# Patient Record
Sex: Female | Born: 1975 | Race: White | Hispanic: No | Smoking: Never smoker
Health system: Southern US, Community
[De-identification: ages and names within clinical notes are randomized; demographics above are authoritative.]

---

## 2005-01-25 ENCOUNTER — Observation Stay: Payer: Self-pay | Admitting: Obstetrics & Gynecology

## 2005-02-17 ENCOUNTER — Inpatient Hospital Stay: Payer: Self-pay

## 2005-12-18 ENCOUNTER — Ambulatory Visit: Payer: Self-pay | Admitting: Family Medicine

## 2006-09-30 ENCOUNTER — Ambulatory Visit: Payer: Self-pay | Admitting: Gastroenterology

## 2007-11-19 IMAGING — US ABDOMEN ULTRASOUND
1 series · 17 of 25 positions shown · non-contrast
Comparison: none

REASON FOR EXAM: Reflux. Focus on gallbladder
COMMENTS:

PROCEDURE:     US  - US ABDOMEN GENERAL SURVEY  - December 18, 2005  [DATE]
RESULT:
HISTORY: Reflux, gallbladder disease.

[Series 1: abdomen ultrasound · 17 of 44 slices shown]
[im 1/44]
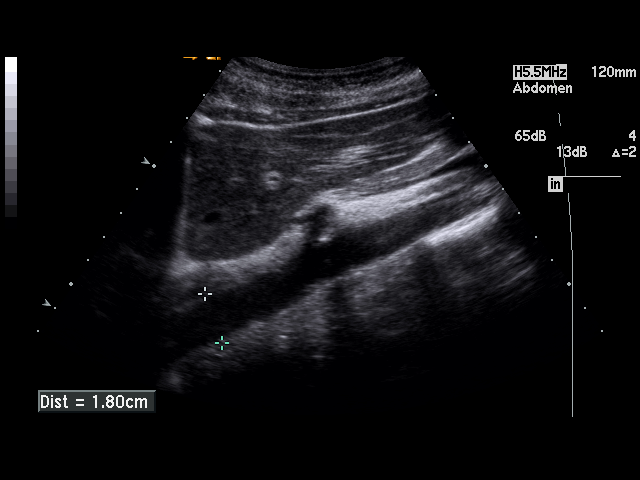
[im 4/44]
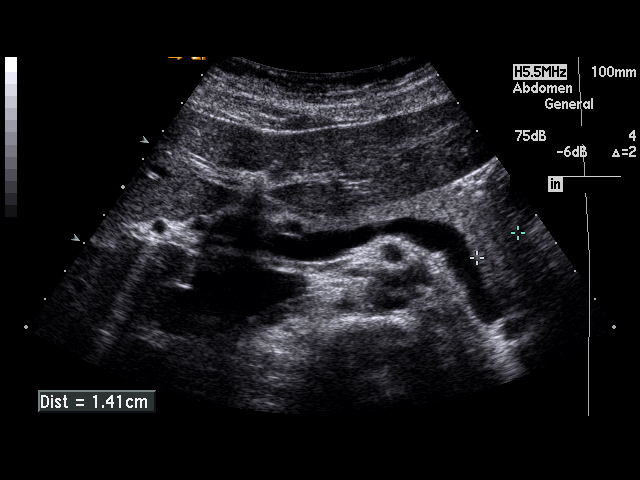
[im 6/44]
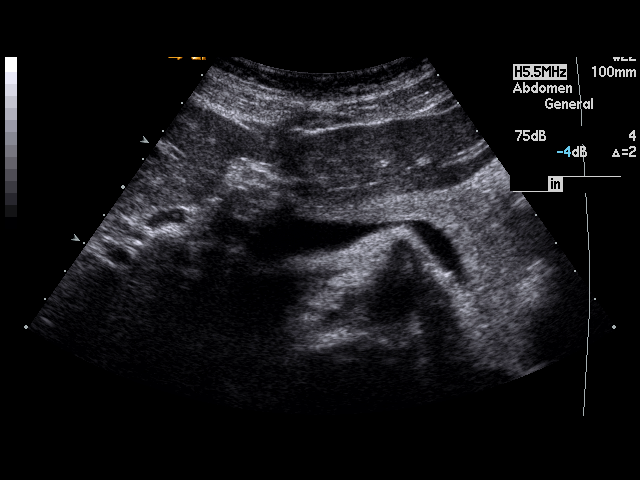
[im 9/44]
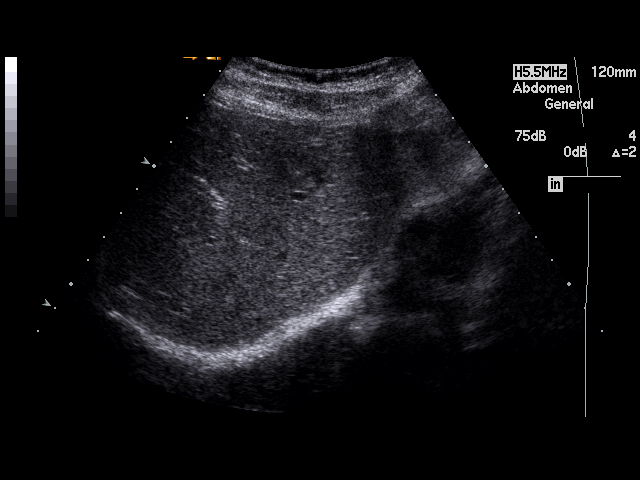
[im 11/44]
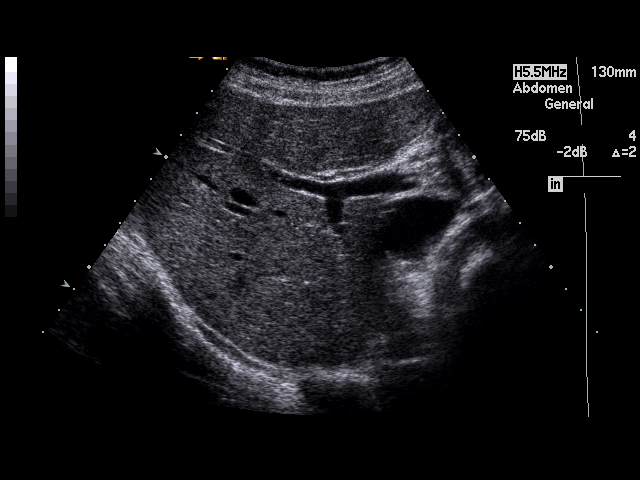
[im 15/44]
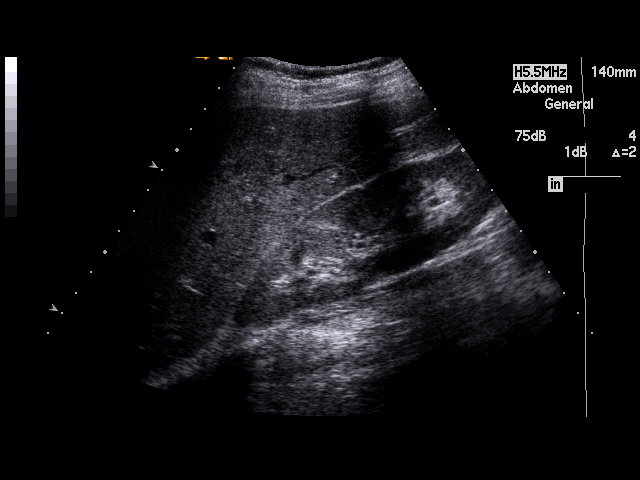
[im 17/44]
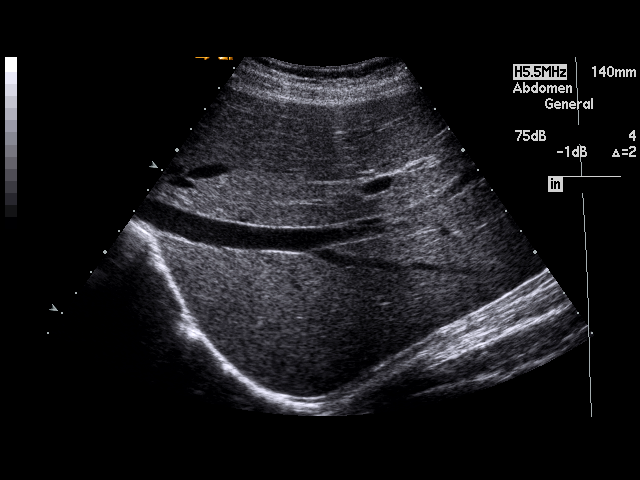
[im 20/44]
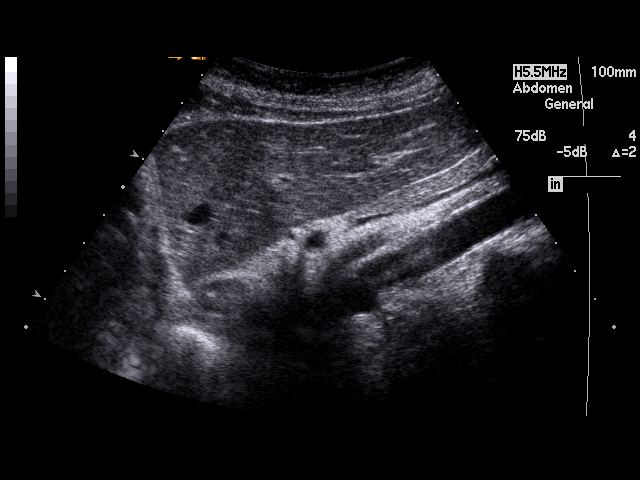
[im 22/44]
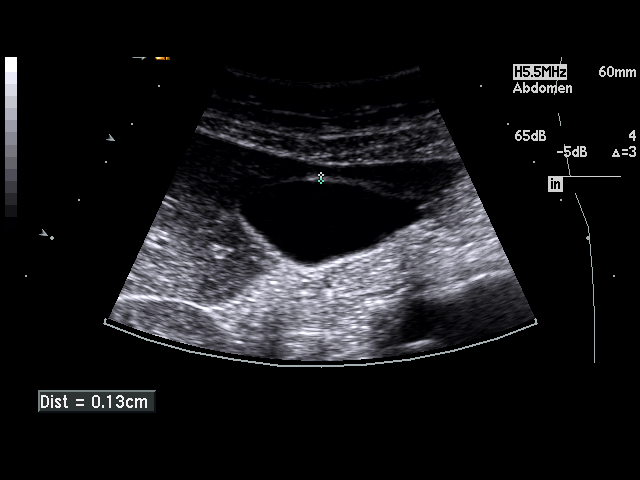
[im 24/44]
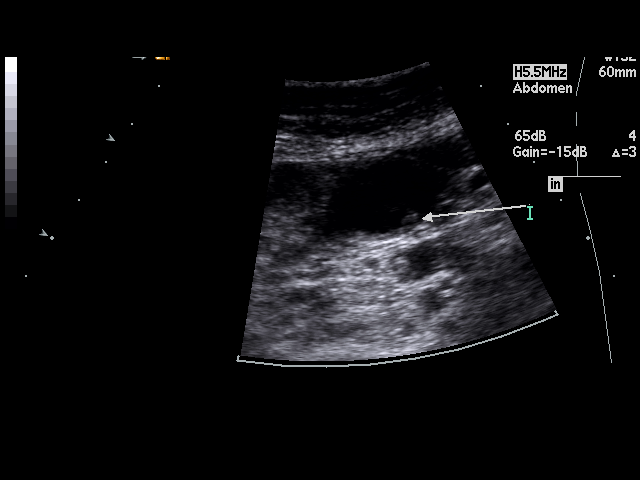
[im 27/44]
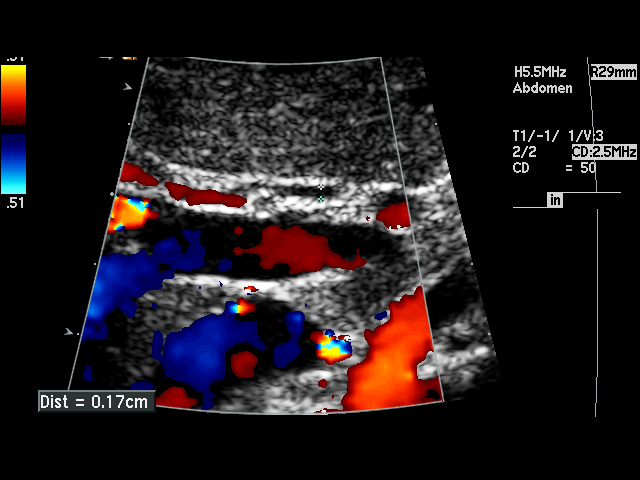
[im 29/44]
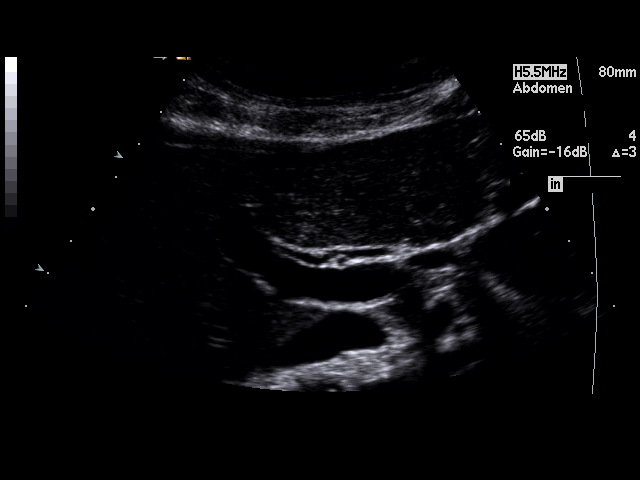
[im 33/44]
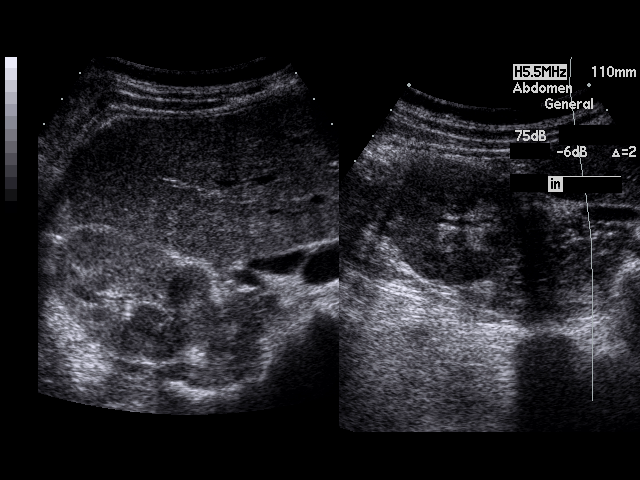
[im 35/44]
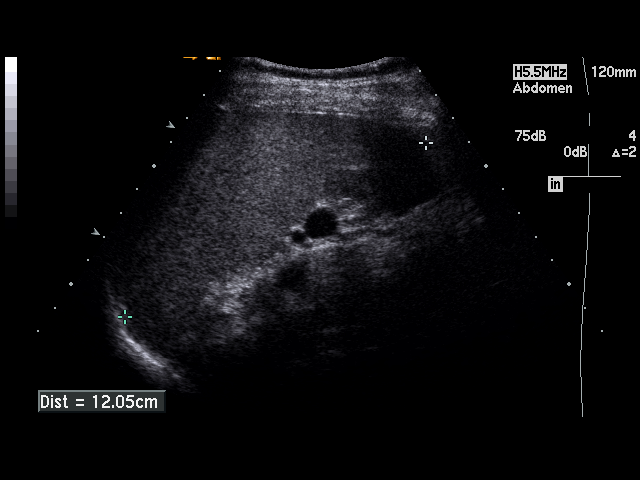
[im 38/44]
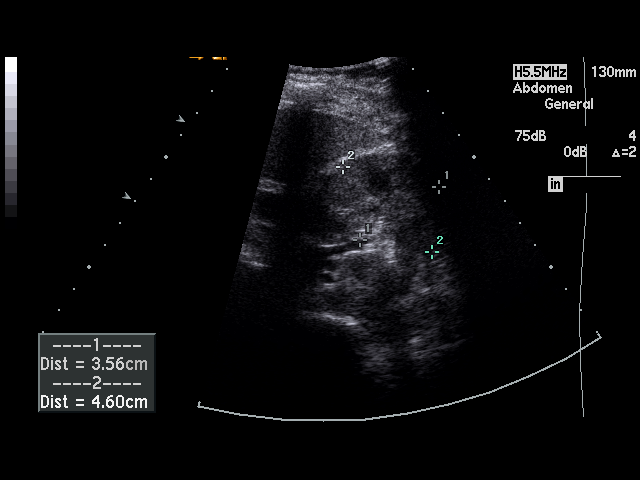
[im 40/44]
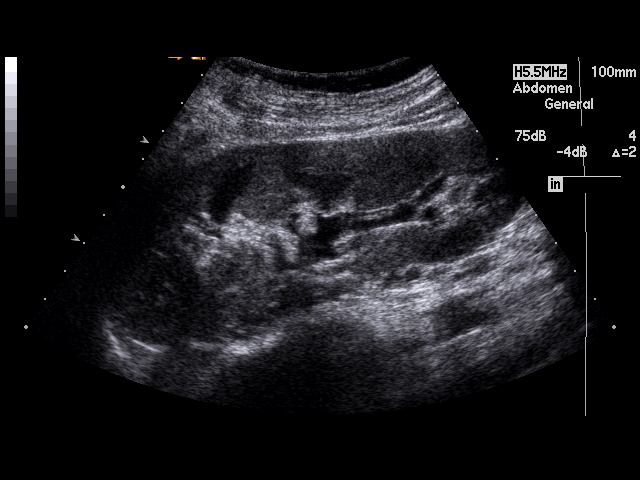
[im 44/44]
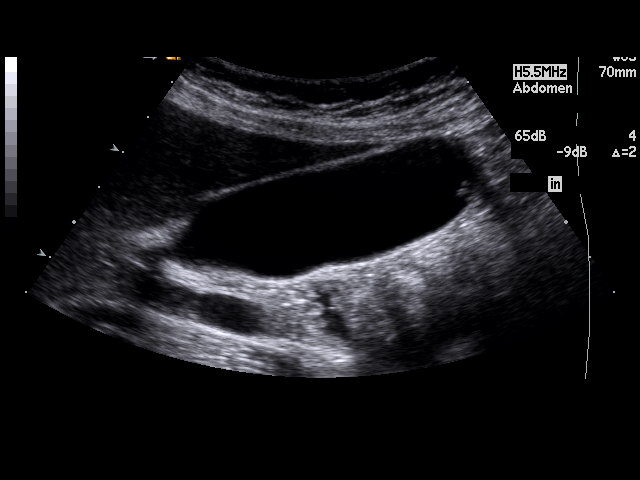

[17 of 25 positions shown; findings below may reference images not displayed]

FINDINGS: Standard abdominal ultrasound was obtained.  The liver is normal.
The pancreas is normal.  Gallstones are present.  No gallbladder wall
thickening is noted.  Common bile duct is normal at 18 mm.  There is no
hydronephrosis or splenomegaly.  The spleen measures 12 cm.  The RIGHT
kidney measures 13 cm in maximal length. The LEFT 12.8 cm.
IMPRESSION: Gallstones, otherwise normal exam.

## 2008-10-30 ENCOUNTER — Encounter: Admission: RE | Admit: 2008-10-30 | Discharge: 2008-10-30 | Payer: Self-pay | Admitting: Obstetrics and Gynecology

## 2009-03-22 ENCOUNTER — Ambulatory Visit: Payer: Self-pay | Admitting: Internal Medicine

## 2009-04-24 ENCOUNTER — Encounter: Admission: RE | Admit: 2009-04-24 | Discharge: 2009-04-24 | Payer: Self-pay | Admitting: Internal Medicine

## 2009-07-06 ENCOUNTER — Ambulatory Visit: Payer: Self-pay | Admitting: Internal Medicine

## 2009-07-10 ENCOUNTER — Ambulatory Visit: Payer: Self-pay | Admitting: Internal Medicine

## 2009-08-06 ENCOUNTER — Ambulatory Visit: Payer: Self-pay | Admitting: Unknown Physician Specialty

## 2018-05-13 ENCOUNTER — Encounter

## 2018-05-13 ENCOUNTER — Ambulatory Visit: Payer: Self-pay | Admitting: Psychiatry

## 2018-05-27 ENCOUNTER — Ambulatory Visit: Payer: Self-pay | Admitting: Psychiatry

## 2018-05-27 ENCOUNTER — Encounter: Payer: Self-pay | Admitting: Psychiatry

## 2018-05-27 DIAGNOSIS — F411 Generalized anxiety disorder: Secondary | ICD-10-CM

## 2018-05-27 NOTE — Progress Notes (Signed)
      [facility] Counselor/Therapist Progress Note  Patient ID: Kimberly Russo, MRN: [id number],    Date: 05/27/2018  Time Spent: 52 minutes   Treatment Type: Individual Therapy  Reported Symptoms: Anxious Mood  Mental Status Exam:  Appearance:   Casual and Well Groomed     Behavior:  Appropriate  Motor:  Normal  Speech/Language:   Clear and Coherent  Affect:  Appropriate  Mood:  anxious  Thought process:  normal  Thought content:    WNL  Sensory/Perceptual disturbances:    WNL  Orientation:  oriented to person, place, time/date and situation  Attention:  Good  Concentration:  Good  Memory:  WNL  Fund of knowledge:   Good  Insight:    Good  Judgment:   Good  Impulse Control:  Good   Risk Assessment: Danger to Self:  No Self-injurious Behavior: No Danger to Others: No Duty to Warn:no Physical Aggression / Violence:No  Access to Firearms a concern: No  Gang Involvement:No   Subjective: The client reports that she was very encouraged by her last session.  Today we began with the EMDR over her current anxiety and the sense of a loss of control.  Her negative thought was, "I am so uncertain."  She feels anxiety in her chest and her subjective units of distress is 5+.  As the client processed her anxiety went up to probably a 7+.  She noted that her anxiety started morphing into the fibromyalgia pain that she feels in her arms and her jaw.  As the client continues she interpreted the feelings as, "not knowing what to do, being misunderstood, there is something wrong with me or this is more than I can handle." As the client gained insight into this her anxiety began to drop precipitously.  I explained to the client about her personality style with the Rosalio Macadamia type indicator.  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  Which means she is less than 2% of the population.  The client found a lot of relief in this.  And she began to have a greater acceptance of who she was.  The client  was surprised that I had been able to assess her so well.  I encouraged the client to develop more skills and learn how to do things on her own.  Her positive cognition was, "I am seen"  Her subjective units of distress was less than 1 at the end of the session.  Interventions: Solution-Oriented/Positive Psychology, Eye Movement Desensitization and Reprocessing (EMDR) and Insight-Oriented  Diagnosis:   ICD-10-CM   1. Generalized anxiety disorder F41.1     Plan: Positive self talk, acceptance, skill development.  Kimberly Russo, [place]

## 2018-06-02 ENCOUNTER — Encounter: Payer: Self-pay | Admitting: Psychiatry

## 2018-06-02 ENCOUNTER — Ambulatory Visit: Payer: Self-pay | Admitting: Psychiatry

## 2018-06-02 DIAGNOSIS — F411 Generalized anxiety disorder: Secondary | ICD-10-CM

## 2018-06-02 NOTE — Progress Notes (Signed)
      [facility] Counselor/Therapist Progress Note  Patient ID: Carlena Hurldrienne H Mcbane, MRN: [id number],    Date: 06/02/2018  Time Spent: 51 minutes   Treatment Type: Individual Therapy  Reported Symptoms: Anxious Mood and Ruminations  Mental Status Exam:  Appearance:   Casual and Well Groomed     Behavior:  Appropriate  Motor:  Normal  Speech/Language:   Clear and Coherent  Affect:  Appropriate  Mood:  anxious and sad  Thought process:  normal  Thought content:    WNL  Sensory/Perceptual disturbances:    WNL  Orientation:  oriented to person, place, time/date and situation  Attention:  Good  Concentration:  Good  Memory:  WNL  Fund of knowledge:   Good  Insight:    Good  Judgment:   Good  Impulse Control:  Good   Risk Assessment: Danger to Self:  No Self-injurious Behavior: No Danger to Others: No Duty to Warn:no Physical Aggression / Violence:No  Access to Firearms a concern: No  Gang Involvement:No   Subjective: The client reports that it was not a stressful week for her which is a huge change.  Today she wanted to focus on her oldest son Alycia RossettiRyan who is 43 years old.  He is over anxious and the client feels responsible for his problems.  She felt she was to she was too much of a disciplinarian and was too anxious around him as well.  She discussed the fact that she is concerned he has poor hygiene and is so much of an introvert and stays to himself. I used EMDR with the client focusing on her son.  Her negative cognition was, "I need to let it go."  She felt guilt in her chest it was subjective units of distress of a 4+.  As the client processed she saw that maybe she was putting too much on him and expecting him to be more mature.  I explained that boys tend to develop socially more slowly and that as he gets older he will take more of an interest in his personal hygiene.  She is worried that he is like her father who is an alcoholic.  He stays at home on the computer overeats and  weighs 300 pounds.  I pointed out with the client that her son takes 100 mg of Zoloft which she states is helping him.  Her father probably resorted to alcohol to deal with his own anxiety as a way to self medicate.  She realized that her son does not have to do that.  He is very different from her father.  "I have to allow him to grow up."  The client's subjective units of distress was at 1 at the end of the session.  Interventions: Solution-Oriented/Positive Psychology, Eye Movement Desensitization and Reprocessing (EMDR) and Insight-Oriented  Diagnosis:   ICD-10-CM   1. Generalized anxiety disorder F41.1     Plan: Boundaries, positive self talk.  Gelene MinkFrederick Maleya Leever, [place]

## 2018-06-10 ENCOUNTER — Ambulatory Visit: Payer: Self-pay | Admitting: Psychiatry

## 2018-06-10 ENCOUNTER — Encounter: Payer: Self-pay | Admitting: Psychiatry

## 2018-06-10 DIAGNOSIS — F411 Generalized anxiety disorder: Secondary | ICD-10-CM

## 2018-06-10 NOTE — Progress Notes (Signed)
      [facility] Counselor/Therapist Progress Note  Patient ID: Kimberly Russo, MRN: [id number],    Date: 06/10/2018  Time Spent: 50 minutes   Treatment Type: Individual Therapy  Reported Symptoms: Anxious Mood  Mental Status Exam:  Appearance:   Casual and Well Groomed     Behavior:  Appropriate  Motor:  Normal  Speech/Language:   Clear and Coherent  Affect:  Appropriate  Mood:  anxious  Thought process:  normal  Thought content:    WNL  Sensory/Perceptual disturbances:    WNL  Orientation:  oriented to person, place, time/date and situation  Attention:  Good  Concentration:  Good  Memory:  WNL  Fund of knowledge:   Good  Insight:    Good  Judgment:   Good  Impulse Control:  Good   Risk Assessment: Danger to Self:  No Self-injurious Behavior: No Danger to Others: No Duty to Warn:no Physical Aggression / Violence:No  Access to Firearms a concern: No  Gang Involvement:No   Subjective: The client reports that the work we did at the last session was helpful.  "I am better with my son."  She has been concerned about his hygiene because, "his feet stink."  I explained to the client that that is not uncommon for boys or girls going through puberty.  The client was unaware of this and immediately had more acceptance.  She felt a sense of relief.  She reported today that she was having a "fibro-flare up."  She wanted to talk about her parents because they worry about everything.  "It dries my anxiety up.  The client notes that she operates out of fear because of this.  We discussed how her mother's behavior introduces a level of uncertainty to the client.  Her negative thought was, "I become fearful out of protection."  Her level of disturbance was a 6.  As she processed her feelings about her parents she realized that it was tainted by shame.  She feels guilty about talking about her parents.  I asked the client to try to take nonjudgmental stance because we were trying to  understand the dynamics that have affected her.  She agreed.  Putting her parents behavior into perspective helped her see how unhealthy it was.  The client was able to reduce her guilt and fear and shame to a subjective units of distress of less than 3 at the end of the session.  Going forward she will focus on trying to trust her own judgment.  Interventions: Assertiveness/Communication, Solution-Oriented/Positive Psychology, Eye Movement Desensitization and Reprocessing (EMDR) and Insight-Oriented  Diagnosis:   ICD-10-CM   1. Generalized anxiety disorder F41.1     Plan: Positive self talk, boundaries, assertiveness.  Kimberly Russo Kimberly Russo, [place]

## 2018-06-17 ENCOUNTER — Ambulatory Visit: Payer: Self-pay | Admitting: Psychiatry

## 2018-06-24 ENCOUNTER — Encounter: Payer: Self-pay | Admitting: Psychiatry

## 2018-06-24 ENCOUNTER — Ambulatory Visit: Payer: Self-pay | Admitting: Psychiatry

## 2018-06-24 DIAGNOSIS — F411 Generalized anxiety disorder: Secondary | ICD-10-CM

## 2018-06-24 NOTE — Progress Notes (Signed)
      [facility] Counselor/Therapist Progress Note  Patient ID: Kimberly Russo, MRN: [id number],    Date: 06/24/2018  Time Spent: 50 minutes   Treatment Type: Individual Therapy  Reported Symptoms: Anxious Mood  Mental Status Exam:  Appearance:   Casual and Well Groomed     Behavior:  Appropriate  Motor:  Normal  Speech/Language:   Clear and Coherent  Affect:  Appropriate  Mood:  anxious  Thought process:  normal  Thought content:    WNL  Sensory/Perceptual disturbances:    WNL  Orientation:  oriented to person, place, time/date and situation  Attention:  Good  Concentration:  Good  Memory:  WNL  Fund of knowledge:   Good  Insight:    Good  Judgment:   Good  Impulse Control:  Good   Risk Assessment: Danger to Self:  No Self-injurious Behavior: No Danger to Others: No Duty to Warn:no Physical Aggression / Violence:No  Access to Firearms a concern: No  Gang Involvement:No   Subjective: The client states [place]  Today she is very anxious and noted that she has a lot of stress around dinner.  She discussed how her dad was always stressful at dinnertime.  Eventually in early middle school the family quit eating together.  Today we used EMDR to help the client separate her dad's behavior from how she think God would treat her.  Her concern is that God is really uninvolved with her like her dad.  As the client processed her subjective units of distress went from a 6+ to less than 1.  She was able to separate her father from her relationship with God.  She saw that God was always with her and that her father was just flawed. Today the client's fibromyalgia had flared up quite a bit.  We discussed how the client's gut health could impact not only her fibromyalgia but her mental health as well.  I gave the client some handouts and information to read concerning that.  She will review that and determine what she will do.  Interventions:  Assertiveness/Communication, Solution-Oriented/Positive Psychology, Eye Movement Desensitization and Reprocessing (EMDR) and Insight-Oriented  Diagnosis:   ICD-10-CM   1. Generalized anxiety disorder F41.1     Plan: Boundaries, assertiveness, diet and self-care.  Kimberly Russo, [place]

## 2018-07-01 ENCOUNTER — Ambulatory Visit: Payer: Self-pay | Admitting: Psychiatry

## 2018-07-01 ENCOUNTER — Encounter: Payer: Self-pay | Admitting: Psychiatry

## 2018-07-01 DIAGNOSIS — F411 Generalized anxiety disorder: Secondary | ICD-10-CM

## 2018-07-01 NOTE — Progress Notes (Signed)
      [facility] Counselor/Therapist Progress Note  Patient ID: Kimberly Russo, MRN: [id number],    Date: 07/01/2018  Time Spent: 48 minutes   Treatment Type: Individual Therapy  Reported Symptoms: Anxiety.  Mental Status Exam:  Appearance:   Casual     Behavior:  Appropriate  Motor:  Normal  Speech/Language:   Clear and Coherent  Affect:  Appropriate  Mood:  anxious  Thought process:  normal  Thought content:    WNL  Sensory/Perceptual disturbances:    WNL  Orientation:  oriented to person, place, time/date and situation  Attention:  Good  Concentration:  Good  Memory:  WNL  Fund of knowledge:   Good  Insight:    Good  Judgment:   Good  Impulse Control:  Good   Risk Assessment: Danger to Self:  No Self-injurious Behavior: No Danger to Others: No Duty to Warn:no Physical Aggression / Violence:No  Access to Firearms a concern: No  Gang Involvement:No   Subjective: Sudden memory she had a playing piano.  "I need to do it right."  She was worried about her dad's criticism.  It also plays into someone else listening or people watching.  People's opinion matter a lot.  "I am always apologizing."  We started with E MDR today around the target of piano playing.  Her negative thought was "I cannot do it right."  As she processed she realized that she put her identity and performance.  Performance leads to need for control which leads to fear if she cannot control.  The client also realized that her dad did not have a good way of communicating praise to her and was trying the best he can.  She realized that she had misinterpreted his behavior through a negative lens.  She noted that she believed it was really not for her what she thought he communicated.  Her positive cognition at the end of the session was, "I can be okay."  Interventions: Solution-Oriented/Positive Psychology, Eye Movement Desensitization and Reprocessing (EMDR) and Insight-Oriented  Diagnosis:   ICD-10-CM    1. Generalized anxiety disorder F41.1     Plan: Positive self talk, diet, exercise, self-care, boundaries, and assertiveness.  Kimberly Russo, [place]

## 2018-07-08 ENCOUNTER — Ambulatory Visit: Payer: Self-pay | Admitting: Psychiatry

## 2018-07-15 ENCOUNTER — Ambulatory Visit: Payer: Self-pay | Admitting: Psychiatry

## 2018-07-15 ENCOUNTER — Encounter: Payer: Self-pay | Admitting: Psychiatry

## 2018-07-15 DIAGNOSIS — F411 Generalized anxiety disorder: Secondary | ICD-10-CM

## 2018-07-15 NOTE — Progress Notes (Signed)
      [facility] Counselor/Therapist Progress Note  Patient ID: MADALYNN PIZANO, MRN: [id number],    Date: 07/15/2018  Time Spent: 51 minutes   Treatment Type: Individual Therapy  Reported Symptoms: anxiety  Mental Status Exam:  Appearance:   Well Groomed     Behavior:  Appropriate  Motor:  Normal  Speech/Language:   Clear and Coherent  Affect:  Full Range  Mood:  anxious  Thought process:  normal  Thought content:    WNL  Sensory/Perceptual disturbances:    WNL  Orientation:  oriented to person, place, time/date and situation  Attention:  Good  Concentration:  Good  Memory:  WNL  Fund of knowledge:   Good  Insight:    Good  Judgment:   Good  Impulse Control:  Good   Risk Assessment: Danger to Self:  No Self-injurious Behavior: No Danger to Others: No Duty to Warn:no Physical Aggression / Violence:No  Access to Firearms a concern: No  Gang Involvement:No   Subjective: "What is churning in my soul today is about my dad."  The client's oldest son is getting ready to graduate high school.  Extended family are coming in from out of town and her parents wanted to stay at her house versus a hotel.  She feels this will excessively stress her out.  She was unable to set the boundaries with her dad and now has to call him back to tell him that they cannot stay there.  We discussed why the client was having so much problem with this.  She feels over responsible for her mom's feelings and does not want to hurt her dad. Today we started with EMDR around her parents.  Her negative cognition was, "I am responsible."  Her subjective units of distress started at an 8+ and ended at a 3.  As the client processed we discussed extensively what codependent behavior looked like.  I gave the client to handouts on codependents to give her more understanding of what it looks like.  The client realizes that the codependent behavior is generational in her family.  As the client processed she became  more confident that she could set the appropriate boundaries with her dad.  At the end of the session she stated, "I am happier and not hopeless."  Interventions: Assertiveness/Communication, Solution-Oriented/Positive Psychology, Eye Movement Desensitization and Reprocessing (EMDR) and Insight-Oriented  Diagnosis:   ICD-10-CM   1. Generalized anxiety disorder F41.1     Plan: Review handouts, assertiveness, boundaries, self-care.  This record has been created using [employer].  Chart creation errors have been sought, but Uchechukwu Dhawan not always have been located and corrected. Such creation errors do not reflect on the standard of medical care.   Loralyn Rachel, [place]

## 2018-07-22 ENCOUNTER — Encounter: Payer: Self-pay | Admitting: Psychiatry

## 2018-07-22 ENCOUNTER — Ambulatory Visit: Payer: Self-pay | Admitting: Psychiatry

## 2018-07-22 ENCOUNTER — Other Ambulatory Visit: Payer: Self-pay

## 2018-07-22 DIAGNOSIS — F411 Generalized anxiety disorder: Secondary | ICD-10-CM

## 2018-07-22 NOTE — Progress Notes (Signed)
      [facility] Counselor/Therapist Progress Note  Patient ID: Kimberly Russo, MRN: [id number],    Date: 07/22/2018  Time Spent: 52 minutes   Treatment Type: Individual Therapy  Reported Symptoms: anxiety  Mental Status Exam:  Appearance:   Well Groomed     Behavior:  Appropriate  Motor:  Normal  Speech/Language:   Clear and Coherent  Affect:  Appropriate  Mood:  anxious  Thought process:  normal  Thought content:    WNL  Sensory/Perceptual disturbances:    WNL  Orientation:  oriented to person, place, time/date and situation  Attention:  Good  Concentration:  Good  Memory:  WNL  Fund of knowledge:   Good  Insight:    Good  Judgment:   Good  Impulse Control:  Good   Risk Assessment: Danger to Self:  No Self-injurious Behavior: No Danger to Others: No Duty to Warn:no Physical Aggression / Violence:No  Access to Firearms a concern: No  Gang Involvement:No   Subjective: The client has been reading the book by [employer] called, "codependent no more."  The client had been going through different symptomology and had questions about how it might apply to her own life.  Her husband came in for part of the session so that I could explain the issues about codependence to the client's husband.  The main issues the client has been having are connected to over responsibility.  As a highly sensitive person she feels things deeply and then wants to take care of the other person which is not her job. We discussed more assertive ways of being direct with her parents and also what appropriate boundaries might look like.  It was helpful that the husband could identify other ways that she was codependent and then craft responses to those.  The client found this all very helpful.  Interventions: Dialectical Behavioral Therapy, Solution-Oriented/Positive Psychology and Insight-Oriented  Diagnosis:   ICD-10-CM   1. Generalized anxiety disorder F41.1     Plan: Continue to review  codependent literature, boundaries, assertiveness.  This record has been created using [employer].  Chart creation errors have been sought, but [name] not always have been located and corrected. Such creation errors do not reflect on the standard of medical care.   [name], [place]

## 2018-07-29 ENCOUNTER — Other Ambulatory Visit: Payer: Self-pay

## 2018-07-29 ENCOUNTER — Ambulatory Visit: Payer: Self-pay | Admitting: Psychiatry

## 2018-07-29 ENCOUNTER — Encounter: Payer: Self-pay | Admitting: Psychiatry

## 2018-07-29 DIAGNOSIS — F411 Generalized anxiety disorder: Secondary | ICD-10-CM

## 2018-07-29 NOTE — Progress Notes (Signed)
      [facility] Counselor/Therapist Progress Note  Patient ID: Kimberly Russo, MRN: [id number],    Date: 07/29/2018  Time Spent: 50 minutes   Treatment Type: Individual Therapy  Reported Symptoms: anxiety  Mental Status Exam:  Appearance:   Casual and Well Groomed     Behavior:  Appropriate  Motor:  Normal  Speech/Language:   Clear and Coherent  Affect:  Appropriate  Mood:  anxious  Thought process:  normal  Thought content:    WNL  Sensory/Perceptual disturbances:    WNL  Orientation:  oriented to person, place, time/date and situation  Attention:  Good  Concentration:  Good  Memory:  WNL  Fund of knowledge:   Good  Insight:    Good  Judgment:   Good  Impulse Control:  Good   Risk Assessment: Danger to Self:  No Self-injurious Behavior: No Danger to Others: No Duty to Warn:no Physical Aggression / Violence:No  Access to Firearms a concern: No  Gang Involvement:No   Subjective: The client felt like a lot of loose ends were tied up for her after last session.  She brought in some questions around codependence, shame and guilt.  She wanted to make sure that she was not being over responsible when she would have deep empathy or compassion for someone else.  We talked about unhealthy shame versus healthy shame.  There is some shame that is helpful socially that becomes a reminder to avoid those behaviors in the future.  Chronic shame is clearly unhealthy.  The client was able to have a better understanding in terms of guilt.  She states that that comes out of her ability to fix or control a situation.  She also came to the conclusion that she does not have to feel bad about her feelings.  They are what they are it is what she determines to do with them. Since the client is home with all her children from school due to the coronavirus pandemic we talked about what opportunities she might have to parent and invested in her children differently.  We discussed how she could make  emotional deposits into her oldest son by doing shoulder to shoulder activities with him.  The client thought this was a great idea and would find opportunities to do that.  Interventions: Solution-Oriented/Positive Psychology, Eye Movement Desensitization and Reprocessing (EMDR) and Insight-Oriented  Diagnosis:   ICD-10-CM   1. Generalized anxiety disorder F41.1     Plan: Affirmation with her children, positive self talk, boundaries, assertiveness, self-care.  This record has been created using [employer].  Chart creation errors have been sought, but Havannah Streat not always have been located and corrected. Such creation errors do not reflect on the standard of medical care.   Hyden Soley, [place]

## 2018-08-05 ENCOUNTER — Ambulatory Visit: Payer: Self-pay | Admitting: Psychiatry

## 2018-08-05 ENCOUNTER — Other Ambulatory Visit: Payer: Self-pay

## 2018-09-01 ENCOUNTER — Ambulatory Visit: Payer: Self-pay | Admitting: Psychiatry

## 2018-09-08 ENCOUNTER — Ambulatory Visit: Payer: Self-pay | Admitting: Psychiatry

## 2018-09-15 ENCOUNTER — Ambulatory Visit (INDEPENDENT_AMBULATORY_CARE_PROVIDER_SITE_OTHER): Payer: Self-pay | Admitting: Psychiatry

## 2018-09-15 ENCOUNTER — Encounter: Payer: Self-pay | Admitting: Psychiatry

## 2018-09-15 ENCOUNTER — Other Ambulatory Visit: Payer: Self-pay

## 2018-09-15 DIAGNOSIS — F411 Generalized anxiety disorder: Secondary | ICD-10-CM

## 2018-09-15 NOTE — Progress Notes (Signed)
      [facility] Counselor/Therapist Progress Note  Patient ID: Kimberly Russo, MRN: [id number],    Date: 09/15/2018  Time Spent: 50 minutes   Treatment Type: Individual Therapy  Reported Symptoms: anxious, uncertainty.  Mental Status Exam:  Appearance:   Casual and Well Groomed     Behavior:  Appropriate  Motor:  Normal  Speech/Language:   Clear and Coherent  Affect:  Appropriate  Mood:  anxious  Thought process:  normal  Thought content:    WNL  Sensory/Perceptual disturbances:    WNL  Orientation:  oriented to person, place, time/date and situation  Attention:  Good  Concentration:  Good  Memory:  WNL  Fund of knowledge:   Good  Insight:    Good  Judgment:   Good  Impulse Control:  Good   Risk Assessment: Danger to Self:  No Self-injurious Behavior: No Danger to Others: No Duty to Warn:no Physical Aggression / Violence:No  Access to Firearms a concern: No  Gang Involvement:No   Subjective: Telehealth visit I connected with patient by a video enabled telemedicine/telehealth application or telephone, with her informed consent, and verified patient privacy and that I am speaking with the correct person using two identifiers.  I was located at my office and patient at her home.  We discussed the limitations, risks, and security and privacy concerns associated with telehealth services and the availability of in-person appointments, including awareness that she Kimberly Russo be responsible for charges related to the service, and she expressed understanding and agreed to proceed.  I discussed treatment planning with her, with opportunity to ask and answer all questions. Agreed with the plan, demonstrated an understanding of the instructions, and made her aware to call our office if symptoms worsen or she feels she is in a crisis state and needs immediate contact. The client reports that she had been doing well until she talked to her father.  She stated, "it churned up a lot of  negative feelings.  My dad felt like I was being very judgmental with him and I ended up apologizing."  The client wanted to process the conversation with her father.  We discussed that the client's dad is a recovering alcoholic and has been narcissistic for most of his life.  She has been sad and upset that her dad does not know her the way that she would like.  But again she fears, "if you really knew me you wouldn't like me."  I used eye-movement with the client today focusing on her anxiety and the longing to be known by her dad.  As she processed a lot of grief came up and sadness about this.  She reports that he did tell her he loved her unconditionally but it never really plays out in his behavior.  The client agreed that she had to have radical acceptance and let go of trying to get her dad to love her the way that she wants.  "I have to turn it over to the care of God."  Her subjective units of distress went from a 5+ to less than 1 at the end of the session.    Interventions: Assertiveness/Communication, Solution-Oriented/Positive Psychology, Eye Movement Desensitization and Reprocessing (EMDR) and Insight-Oriented  Diagnosis:   ICD-10-CM   1. Generalized anxiety disorder F41.1     Plan: radical acceptance, positive self talk, assertiveness.  Kimberly Russo, [facility]

## 2018-10-01 ENCOUNTER — Ambulatory Visit: Payer: Self-pay | Admitting: Psychiatry

## 2018-10-01 ENCOUNTER — Other Ambulatory Visit: Payer: Self-pay

## 2018-10-13 ENCOUNTER — Ambulatory Visit: Payer: Self-pay | Admitting: Psychiatry

## 2020-04-24 ENCOUNTER — Ambulatory Visit (INDEPENDENT_AMBULATORY_CARE_PROVIDER_SITE_OTHER): Payer: Commercial Managed Care - PPO | Admitting: Psychiatry

## 2020-04-24 ENCOUNTER — Other Ambulatory Visit: Payer: Self-pay

## 2020-04-24 DIAGNOSIS — F411 Generalized anxiety disorder: Secondary | ICD-10-CM

## 2020-04-25 ENCOUNTER — Encounter: Payer: Self-pay | Admitting: Psychiatry

## 2020-04-25 NOTE — Progress Notes (Signed)
[facility] Counselor/Therapist Progress Note  Patient ID: Kimberly Russo, MRN: [id number],    Date: 04/24/2020  Time Spent: 50 minutes   Treatment Type: Individual Therapy  Reported Symptoms: anxiety, rumination, negative thoughts  Mental Status Exam:  Appearance:   Casual     Behavior:  Appropriate  Motor:  Normal  Speech/Language:   Clear and Coherent  Affect:  Appropriate  Mood:  anxious and sad  Thought process:  normal  Thought content:    Rumination  Sensory/Perceptual disturbances:    WNL  Orientation:  oriented to person, place, time/date and situation  Attention:  Good  Concentration:  Good  Memory:  WNL  Fund of knowledge:   Good  Insight:    Good  Judgment:   Good  Impulse Control:  Good   Risk Assessment: Danger to Self:  No Self-injurious Behavior: No Danger to Others: No Duty to Warn:no Physical Aggression / Violence:No  Access to Firearms a concern: No  Gang Involvement:No   Subjective: The client has struggled in her relationship with both her father and her husband.  Her husband is involved with young life at the local high school.  On September 1 there was a school shooting there and her husband was at school taking care of everyone else.  "It triggered me and I felt abandoned which hurts my feelings."  She notes that in her marriage she has a great need to be validated.  "If I get criticized it is soul crushing."  She also related a story where her husband was at a conference talking to a female counselor friend who does EMDR.  "I was hurt he talked about me to a counselor about very private things".  Today I used eye-movement with the client focusing on her anxiety around criticism or feeling abandoned.  Her negative cognition is, "I am trying so hard and it is still not enough."  As the client processed her subjective units of distress was a 7 which she felt in her chest.  She feels rejected by her dad, alone and not known.  She states it feels  like she has no control.  She related a story where her oldest son was preparing for college but thought that he Aune Adami want to learn a trade and not go to school.  It turns out he was afraid that he would not be strong enough to go to school.  They asked him if he would like to go to a [place] school?  He said that he would.  Previous to that he had spent part of the summer with his maternal grandfather talking about trades.  The client's father was very excited about this but became very angry when he realized that his grandson was going to [employer].  The client states, "that hurt me."  As the client continue to process I pointed out that she kept engaging in catastrophic thought processes where she would interpret or mind read what was going on and then make decisions based on that.  The client stated that this was true.  I gave her a handout on thinking errors for her to review which she did.  She realizes that she has to stay out that way of thinking to keep her mood more stable.  I use the bilateral stimulation hand paddles to do a visualization of the client on the beach with [place].  She felt a great amount of comfort and understanding that she could begin to  accept things at face value with people.  Interventions: Assertiveness/Communication, Mindfulness Meditation, Motivational Interviewing, Solution-Oriented/Positive Psychology, [employer] Desensitization and Reprocessing (EMDR) and Insight-Oriented  Diagnosis:   ICD-10-CM   1. Generalized anxiety disorder  F41.1     Plan: Mindfulness, mood independent behavior, avoid thinking errors (thinking errors handout), positive self talk, self-care, radical acceptance, assertiveness, boundaries.  Gelene Mink Rhyse Loux, [facility]

## 2020-07-06 ENCOUNTER — Other Ambulatory Visit: Payer: Self-pay

## 2020-07-06 ENCOUNTER — Encounter: Payer: Self-pay | Admitting: Psychiatry

## 2020-07-06 ENCOUNTER — Ambulatory Visit (INDEPENDENT_AMBULATORY_CARE_PROVIDER_SITE_OTHER): Payer: Self-pay | Admitting: Psychiatry

## 2020-07-06 DIAGNOSIS — F411 Generalized anxiety disorder: Secondary | ICD-10-CM

## 2020-07-06 NOTE — Progress Notes (Signed)
      [facility] Counselor/Therapist Progress Note  Patient ID: Kimberly Russo, MRN: [id number],    Date: 07/06/2020  Time Spent: 50 minutes   Treatment Type: Individual Therapy  Reported Symptoms: anxiety  Mental Status Exam:  Appearance:   Casual     Behavior:  Appropriate  Motor:  Normal  Speech/Language:   Clear and Coherent  Affect:  Appropriate  Mood:  anxious  Thought process:  normal  Thought content:    WNL  Sensory/Perceptual disturbances:    WNL  Orientation:  oriented to person, place, time/date and situation  Attention:  Good  Concentration:  Good  Memory:  WNL  Fund of knowledge:   Good  Insight:    Good  Judgment:   Good  Impulse Control:  Good   Risk Assessment: Danger to Self:  No Self-injurious Behavior: No Danger to Others: No Duty to Warn:no Physical Aggression / Violence:No  Access to Firearms a concern: No  Gang Involvement:No   Subjective: The client states that she is going on vacation with her mom to [facility] [place], [place]. "Being with her drains me." The client states that she thinks her mom might have obsessive-compulsive disorder. She states that her mom had bulimia when the client was a child. "I would go to sleep hearing her throw up." Today we used the bilateral stimulation hand paddles and eye-movement to process through the client's anxiety about being on vacation with her mom. Her negative cognition is, "I feel powerless." Her subjective units of distress is a five. She feels anxiety in her chest. As the client processed she stated that her mother is very insecure. She is very fit and skinny but thinks she is fat. She remembers her mother and dad fighting a lot when she was a child. The client would get in between her parents to prevent anything violent from happening. The client's goal for vacation with her mom is to have some solitude, mindfulness and time with her mom. I asked the client if she has any kind of schedule planned? She  stated that she has not. I suggested she make some of those plans so that she can get the solitude that she needs. She becomes very anxious when her mother talks so much. "I also judge her." I suggested that for her mom Phenix Vandermeulen be her talking relieves her anxiety? This made a lot of sense to the client. She was able to feel more positive anticipation about going. She felt better that she could except her mom. Her subjective units of distress was less than one.  Interventions: Assertiveness/Communication, Mindfulness Meditation, Motivational Interviewing, Solution-Oriented/Positive Psychology, [employer] Desensitization and Reprocessing (EMDR) and Insight-Oriented  Diagnosis:   ICD-10-CM   1. Generalized anxiety disorder  F41.1     Plan: Mindfulness, mood independent behavior, positive self talk, self-care, schedule the days for her vacation, boundaries, assertiveness.  Gelene Mink Ivalene Platte, [facility]

## 2020-07-26 ENCOUNTER — Ambulatory Visit: Payer: Commercial Managed Care - PPO | Admitting: Psychiatry

## 2020-08-09 ENCOUNTER — Ambulatory Visit: Payer: Commercial Managed Care - PPO | Admitting: Psychiatry

## 2020-08-31 ENCOUNTER — Other Ambulatory Visit: Payer: Self-pay

## 2020-08-31 ENCOUNTER — Ambulatory Visit (INDEPENDENT_AMBULATORY_CARE_PROVIDER_SITE_OTHER): Payer: Commercial Managed Care - PPO | Admitting: Psychiatry

## 2020-08-31 ENCOUNTER — Encounter: Payer: Self-pay | Admitting: Psychiatry

## 2020-08-31 DIAGNOSIS — F411 Generalized anxiety disorder: Secondary | ICD-10-CM | POA: Diagnosis not present

## 2020-08-31 NOTE — Progress Notes (Signed)
      [facility] Counselor/Therapist Progress Note  Patient ID: Kimberly Russo, MRN: [id number],    Date: 08/31/2020  Time Spent: 50 minutes   Treatment Type: Individual Therapy  Reported Symptoms: anxiety  Mental Status Exam:  Appearance:   Well Groomed     Behavior:  Appropriate  Motor:  Normal  Speech/Language:   Clear and Coherent  Affect:  Appropriate  Mood:  anxious  Thought process:  normal  Thought content:    WNL  Sensory/Perceptual disturbances:    WNL  Orientation:  oriented to person, place, time/date and situation  Attention:  Good  Concentration:  Good  Memory:  WNL  Fund of knowledge:   Good  Insight:    Good  Judgment:   Good  Impulse Control:  Good   Risk Assessment: Danger to Self:  No Self-injurious Behavior: No Danger to Others: No Duty to Warn:no Physical Aggression / Violence:No  Access to Firearms a concern: No  Gang Involvement:No   Subjective: I met with the client today along with her husband as a collateral.  She wanted her husband to hear my thoughts on dealing with her dad.  She also wanted me to understand howt her husband experiences her father as well.  The client states that she is still stuck in the same trauma with her dad telling her that she needs to lose weight.  That communicated to her that she was not good enough.  The client became very tearful at this point.  She wants to "tell her dad off" but feels that that is not the [place] thing to do.  The client's husband states that his father-in-law has been inappropriate, self absorbed, critical and lacks insight.  He identified that shame is his wife's underlying issue with her dad.  We discussed that the client's dad has no emotional intelligence and lacks empathy.  He clearly has some form of the personality disorder and cannot be expected to respond the way the client would like.  I pointed out that he would never have the insight to apologize to her or understand the hurt that he  has caused her.  I suggested to the client that she think about writing a letter to her dad but running it by her husband and crafting it to say exactly what she wants without being punitive.  If she continues to let her dad off the hook for bad behavior then he never gets to learn.  The client agreed.  Services ended by mutual consent I explained to the client that I was retiring at the end of the month.  The client had not received the letter but felt that she was in a good place.  I suggested that she follow up with Kimberly Russo, [facility] in [place] as a backup.  She agreed that she would.  Interventions: Assertiveness/Communication, Motivational Interviewing, Solution-Oriented/Positive Psychology and Insight-Oriented  Diagnosis:   ICD-10-CM   1. Generalized anxiety disorder  F41.1     Plan: Mood independent behavior, positive self talk, self-care, assertiveness, boundaries, journaling, letter.  Clydell Alberts, [facility]

## 2020-09-13 ENCOUNTER — Ambulatory Visit: Payer: Commercial Managed Care - PPO | Admitting: Psychiatry
